# Patient Record
Sex: Female | Born: 1970 | Race: White | Hispanic: No | Marital: Married | Smoking: Never smoker
Health system: Southern US, Community
[De-identification: ages and names within clinical notes are randomized; demographics above are authoritative.]

---

## 2011-10-28 ENCOUNTER — Ambulatory Visit: Payer: Self-pay | Admitting: Internal Medicine

## 2011-10-28 LAB — URINALYSIS, COMPLETE
Bacteria: NEGATIVE
Bilirubin,UR: NEGATIVE
Glucose,UR: NEGATIVE mg/dL (ref 0–75)
Ketone: NEGATIVE
Leukocyte Esterase: NEGATIVE
Nitrite: NEGATIVE
Ph: 6.5 (ref 4.5–8.0)
Protein: NEGATIVE
Specific Gravity: 1.005 (ref 1.003–1.030)

## 2011-10-28 LAB — COMPREHENSIVE METABOLIC PANEL
Albumin: 3.7 g/dL (ref 3.4–5.0)
Alkaline Phosphatase: 78 U/L (ref 50–136)
Anion Gap: 5 — ABNORMAL LOW (ref 7–16)
Calcium, Total: 9 mg/dL (ref 8.5–10.1)
Chloride: 100 mmol/L (ref 98–107)
Co2: 31 mmol/L (ref 21–32)
Creatinine: 0.97 mg/dL (ref 0.60–1.30)
EGFR (Non-African Amer.): >60
Glucose: 98 mg/dL (ref 65–99)
Osmolality: 271 (ref 275–301)
Potassium: 3.9 mmol/L (ref 3.5–5.1)
SGOT(AST): 13 U/L — ABNORMAL LOW (ref 15–37)
Sodium: 136 mmol/L (ref 136–145)

## 2011-10-28 LAB — TSH: Thyroid Stimulating Horm: 1.01 u[IU]/mL

## 2011-10-28 LAB — CBC WITH DIFFERENTIAL/PLATELET
Basophil #: 0 10*3/uL (ref 0.0–0.1)
Basophil %: 0.6 %
Eosinophil #: 0.1 10*3/uL (ref 0.0–0.7)
Eosinophil %: 0.8 %
HCT: 42.1 % (ref 35.0–47.0)
HGB: 13.6 g/dL (ref 12.0–16.0)
Lymphocyte #: 1.9 10*3/uL (ref 1.0–3.6)
Lymphocyte %: 30.1 %
MCH: 28.4 pg (ref 26.0–34.0)
MCHC: 32.2 g/dL (ref 32.0–36.0)
MCV: 88 fL (ref 80–100)
Monocyte #: 0.5 10*3/uL (ref 0.0–0.7)
Monocyte %: 7.4 %
Neutrophil #: 3.8 10*3/uL (ref 1.4–6.5)
Neutrophil %: 61.1 %
Platelet: 384 10*3/uL (ref 150–440)
RBC: 4.77 10*6/uL (ref 3.80–5.20)
RDW: 12.4 % (ref 11.5–14.5)
WBC: 6.3 10*3/uL (ref 3.6–11.0)

## 2015-12-16 ENCOUNTER — Ambulatory Visit
Admission: EM | Admit: 2015-12-16 | Discharge: 2015-12-16 | Disposition: A | Discharge status: Home / Self Care | Payer: Managed Care, Other (non HMO) | Attending: Family Medicine | Admitting: Family Medicine

## 2015-12-16 DIAGNOSIS — T148 Other injury of unspecified body region: Secondary | ICD-10-CM

## 2015-12-16 DIAGNOSIS — W5581XA Bitten by other mammals, initial encounter: Secondary | ICD-10-CM

## 2015-12-16 DIAGNOSIS — T148XXA Other injury of unspecified body region, initial encounter: Secondary | ICD-10-CM

## 2015-12-16 MED ORDER — AMOXICILLIN-POT CLAVULANATE 875-125 MG PO TABS: 1.0000 | ORAL_TABLET | Freq: Two times a day (BID) | ORAL | Status: AC

## 2015-12-16 MED ORDER — MUPIROCIN 2 % EX OINT: 1.0000 "application " | TOPICAL_OINTMENT | Freq: Three times a day (TID) | CUTANEOUS | Status: AC

## 2015-12-16 NOTE — Discharge Instructions (Signed)
Contact Animal Control to report for animal pick up And establish with them  Antibiotic by mouth as directed Mupirocin antibiotics ointment to wounds Report for re-evaluation in 3 days [facility] or PCP- More quickly if inflammation increased heat- drainage     Animal Bite Animal bites can range from mild to serious. An animal bite can result in a scratch on the skin, a deep open cut, a puncture of the skin, a crush injury, or tearing away of the skin or a body part. A small bite from a house pet will usually not cause serious problems. However, some animal bites can become infected or injure a bone or other tissue.  Bites from certain animals can be more dangerous because of the risk of spreading rabies, which is a serious viral infection. This risk is higher with bites from stray animals or wild animals, such as raccoons, foxes, skunks, and bats. Dogs are responsible for most animal bites. Children are bitten more often than adults. SYMPTOMS  Common symptoms of an animal bite include:   Pain.   Bleeding.   Swelling.   Bruising.  DIAGNOSIS  This condition may be diagnosed based on a physical exam and medical history. Your health care provider will examine the wound and ask for details about the animal and how the bite happened. You may also have tests, such as:   Blood tests to check for infection or to determine if surgery is needed.  X-rays to check for damage to bones or joints.  Culture test. This uses a sample of fluid from the wound to check for infection. TREATMENT  Treatment varies depending on the location and type of animal bite and your medical history. Treatment may include:   Wound care. This often includes cleaning the wound, flushing the wound with saline solution, and applying a bandage (dressing). Sometimes, the wound is left open to heal because of the high risk of infection. However, in some cases, the wound may be closed with stitches (sutures), staples, skin  glue, or adhesive strips.   Antibiotic medicine.   Tetanus shot.   Rabies treatment if the animal could have rabies.  In some cases, bites that have become infected may require IV antibiotics and surgical treatment in the hospital.  HOME CARE INSTRUCTIONS Wound Care  Follow instructions from your health care provider about how to take care of your wound. Make sure you:  Wash your hands with soap and water before you change your dressing. If soap and water are not available, use hand sanitizer.  Change your dressing as told by your health care provider.  Leave sutures, skin glue, or adhesive strips in place. These skin closures may need to be in place for 2 weeks or longer. If adhesive strip edges start to loosen and curl up, you may trim the loose edges. Do not remove adhesive strips completely unless your health care provider tells you to do that.  Check your wound every day for signs of infection. Watch for:   Increasing redness, swelling, or pain.   Fluid, blood, or pus.  General Instructions  Take or apply over-the-counter and prescription medicines only as told by your health care provider.   If you were prescribed an antibiotic, take or apply it as told by your health care provider. Do not stop using the antibiotic even if your condition improves.   Keep the injured area raised (elevated) above the level of your heart while you are sitting or lying down, if this is possible.  If directed, apply ice to the injured area.   Put ice in a plastic bag.   Place a towel between your skin and the bag.   Leave the ice on for 20 minutes, 2-3 times per day.   Keep all follow-up visits as told by your health care provider. This is important.  SEEK MEDICAL CARE IF:  You have increasing redness, swelling, or pain at the site of your wound.   You have a general feeling of sickness (malaise).   You feel nauseous or you vomit.   You have pain that does  not get better.  SEEK IMMEDIATE MEDICAL CARE IF:  You have a red streak extending away from your wound.   You have fluid, blood, or pus coming from your wound.   You have a fever or chills.   You have trouble moving your injured area.   You have numbness or tingling extending beyond the wound.   This information is not intended to replace advice given to you by your health care provider. Make sure you discuss any questions you have with your health care provider.   Document Released: 07/01/2011 Document Revised: 07/04/2015 Document Reviewed: 02/28/2015 Elsevier Interactive Patient Education [employer]

## 2015-12-16 NOTE — ED Notes (Signed)
Cat scratched pt's left arm and bilateral lower legs. Cat's shots are UTD per pt. Pt reports cat is scheduled to be euthanized on Thursday due to previous history of aggressiveness.

## 2015-12-19 ENCOUNTER — Encounter: Payer: Self-pay | Admitting: Physician Assistant

## 2015-12-19 NOTE — ED Provider Notes (Signed)
CSN: [id number]     Arrival date & time 12/16/15  1522 History   First MD Initiated Contact with Patient 12/16/15 1646     Chief Complaint  Patient presents with  . Abrasion  . Animal Bite   (Consider location/radiation/quality/duration/timing/severity/associated sxs/prior Treatment) HPI 45 yo F presents with her husband shortly after being bitten and scratched quite severely by her mother's cat.  Cat has been irritable with humans before. Mother was treated 2 weeks ago for similar episode.  Decision has apparently  been made to put animal down but delayed a week. - apparently were trying to find a rescue. Animal has been in large dog crate in [place] garage so she could feed instead of mother.  She took it out today to photograph it- rested quietly in her lap and then suddenly attacked her arm biting and scratching the fell to ground and attacked her legs. Tetanus current  Cat rabies tag expired 12/06/15. Authorities were notified of aggressive animal  injuries and expired information. The family is to call Animal Control when they get home  Authorities report they  will meet the family at their home. Cat is loose in the garage   [facility] was contacted for info re protocol. They Rx atb and d/c home- they have experience with recent change  in Animal control Presenting to family home instead of care facility.  History reviewed. No pertinent past medical history. History reviewed. No pertinent past surgical history. History reviewed. No pertinent family history. Social History  Substance Use Topics  . Smoking status: Never Smoker   . Smokeless tobacco: None  . Alcohol Use: No   OB History    No data available     Review of Systems. Constitutional: No fever. No headache. Eyes: No visual changes. ENT:No sore throat. Cardiovascular:Negative for chest pain/palpitations Respiratory: Negative for shortness of breath Gastrointestinal: No abdominal pain. No nausea,vomiting,  diarrhea Genitourinary: Negative for dysuria. Normal urination. Menses current. Musculoskeletal: Negative for back pain. FROM extremities many linear scratches and bites, bleeding stopped Skin: Negative for rash Neurological: Negative for headache, focal weakness or numbness  Allergies  Penicillins and Sulfa antibiotics  Home Medications   Prior to Admission medications   Medication Sig Start Date End Date Taking? Authorizing Provider  amoxicillin-clavulanate (AUGMENTIN) 875-125 MG tablet Take 1 tablet by mouth every 12 (twelve) hours. 12/16/15   Rae Halsted, PA-C  mupirocin ointment (BACTROBAN) 2 % Apply 1 application topically 3 (three) times daily. 12/16/15   Rae Halsted, PA-C   Meds Ordered and Administered this Visit  Medications - No data to display  BP 171/97 mmHg  Pulse 92  Temp(Src) 99.3 F (37.4 C) (Oral)  Resp 16  Ht 5' 11.5" (1.816 m)  Wt 173 lb (78.472 kg)  BMI 23.79 kg/m2  SpO2 100%  LMP 12/14/2015 (Within Days) No data found.   Physical Exam   VS noted, WNL  GENERAL : NAD HEENT: mucoas moist, FROM head.neck, normal hearing , conjugate gaze RESP: CTA  B , no wheezing, no accessory muscle use CARD: RRR ABD: Not distended NEURO: Good attention, good recall, no gross neuro defecit PSYCH: speech and behavior appropriate Skin: extensive scratches of right forearm and bilateral legs below the knee.  Two moderately deep bite wound of lateral right upper arm and left posterior calf leg. Cleansed carefully.   Ambulates without difficulty. Tender.   ED Course  Procedures (including critical care time)  Labs Review Labs Reviewed - No data to display  Imaging Review No results found.   Wounds are cleansed with surgical scrub sponge and copious rinsing. Neosporin dressing and gauze wraps Keep clean. Punctures to be kept dressed at all times. Report if induration , swelling or drainage develops. PO And topical antibiotics as directed  Advised to avoid the  animal and let Trained officers capture   MDM   1. Animal bites    Wounds clean and dry- antibiotic ointment TID PO BID - may rest legs of heating pad if comfortable during healing process. encourage draining if develops- do not squeeze- use padded dressing. Return for re-eval in 3 days [facility] or PCP  Discharge Medication List as of 12/16/2015  5:40 PM    START taking these medications   Details  amoxicillin-clavulanate (AUGMENTIN) 875-125 MG tablet Take 1 tablet by mouth every 12 (twelve) hours., Starting 12/16/2015, Until Discontinued, Print    mupirocin ointment (BACTROBAN) 2 % Apply 1 application topically 3 (three) times daily., Starting 12/16/2015, Until Discontinued, Print          Rae Halsted, PA-C 12/19/15 [phone]

## 2016-09-05 ENCOUNTER — Other Ambulatory Visit: Payer: Self-pay | Admitting: Family Medicine

## 2016-09-05 DIAGNOSIS — R7989 Other specified abnormal findings of blood chemistry: Secondary | ICD-10-CM

## 2016-09-22 ENCOUNTER — Ambulatory Visit (HOSPITAL_COMMUNITY)
Admission: RE | Admit: 2016-09-22 | Discharge: 2016-09-22 | Disposition: A | Discharge status: Home / Self Care | Payer: Managed Care, Other (non HMO) | Source: Ambulatory Visit | Attending: Family Medicine | Admitting: Family Medicine

## 2016-09-22 DIAGNOSIS — R7989 Other specified abnormal findings of blood chemistry: Secondary | ICD-10-CM

## 2016-09-22 DIAGNOSIS — N281 Cyst of kidney, acquired: Secondary | ICD-10-CM | POA: Insufficient documentation

## 2016-10-06 ENCOUNTER — Other Ambulatory Visit: Payer: Self-pay | Admitting: Nephrology

## 2016-10-06 DIAGNOSIS — I10 Essential (primary) hypertension: Secondary | ICD-10-CM

## 2016-10-06 DIAGNOSIS — R7989 Other specified abnormal findings of blood chemistry: Secondary | ICD-10-CM

## 2016-10-06 DIAGNOSIS — IMO0002 Reserved for concepts with insufficient information to code with codable children: Secondary | ICD-10-CM

## 2016-10-07 ENCOUNTER — Other Ambulatory Visit: Payer: Self-pay | Admitting: Nephrology

## 2016-10-07 DIAGNOSIS — R7989 Other specified abnormal findings of blood chemistry: Secondary | ICD-10-CM

## 2016-10-07 DIAGNOSIS — I773 Arterial fibromuscular dysplasia: Secondary | ICD-10-CM

## 2016-10-07 DIAGNOSIS — I10 Essential (primary) hypertension: Secondary | ICD-10-CM

## 2016-10-16 ENCOUNTER — Ambulatory Visit
Admission: RE | Admit: 2016-10-16 | Discharge: 2016-10-16 | Disposition: A | Discharge status: Home / Self Care | Payer: Managed Care, Other (non HMO) | Source: Ambulatory Visit | Attending: Nephrology | Admitting: Nephrology

## 2016-10-16 DIAGNOSIS — R7989 Other specified abnormal findings of blood chemistry: Secondary | ICD-10-CM

## 2016-10-16 DIAGNOSIS — I10 Essential (primary) hypertension: Secondary | ICD-10-CM

## 2016-10-16 DIAGNOSIS — I773 Arterial fibromuscular dysplasia: Secondary | ICD-10-CM

## 2017-10-23 IMAGING — US US RENAL ARTERY STENOSIS
1 series · 14 of 25 positions shown · non-contrast
Comparison: 09/22/2016

CLINICAL DATA: Hypertension and elevated creatinine.

EXAM:
RENAL/URINARY TRACT ULTRASOUND
RENAL DUPLEX DOPPLER ULTRASOUND

[Series 1: us renal artery stenosis · 0.28mm/px · 14 of 34 slices shown]
[im 1/34]
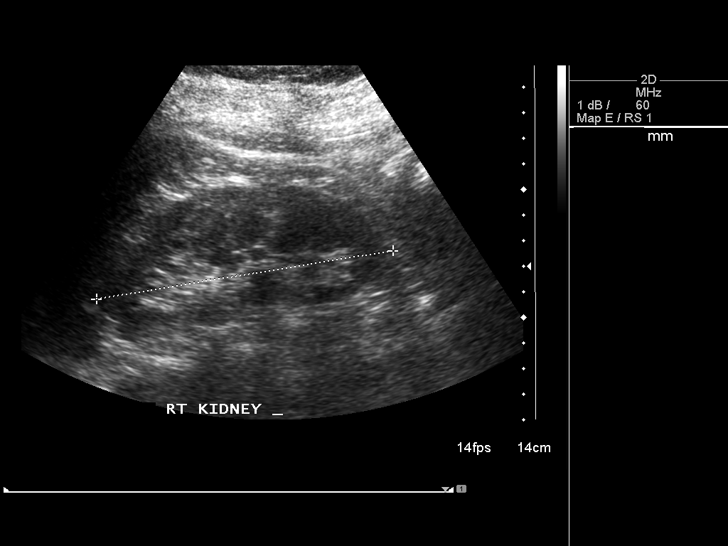
[im 3/34]
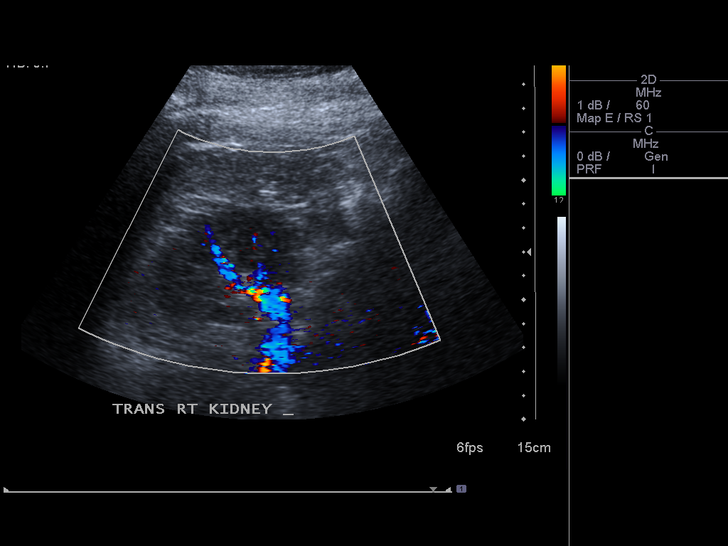
[im 6/34]
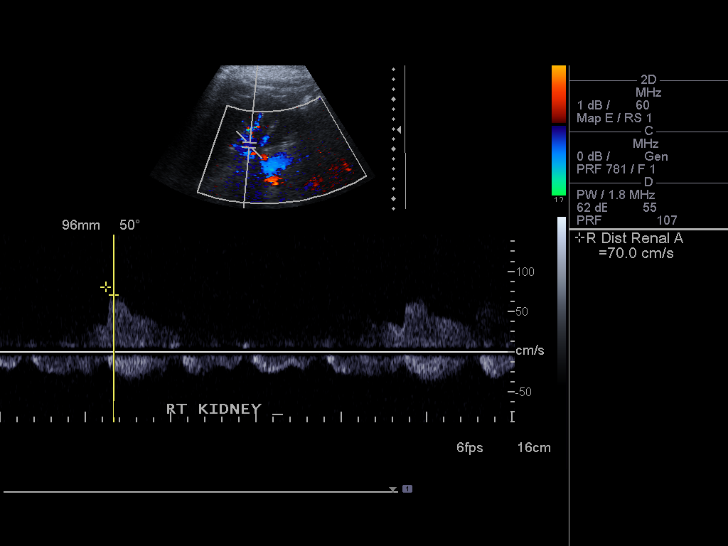
[im 9/34]
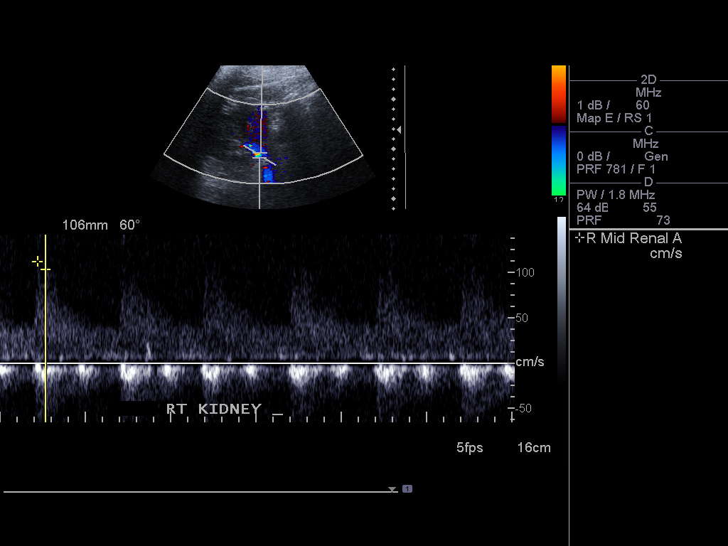
[im 12/34]
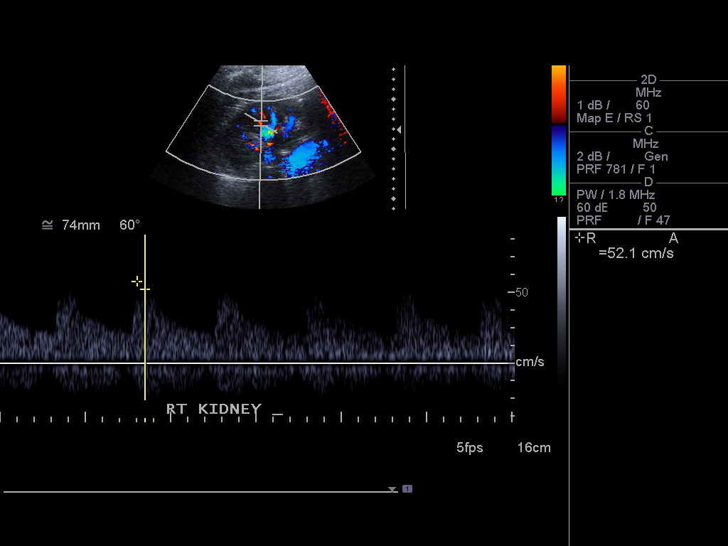
[im 13/34]
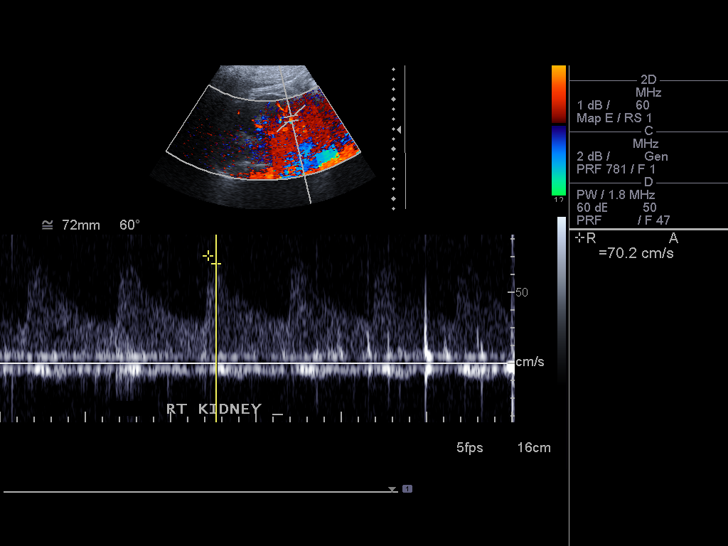
[im 16/34]
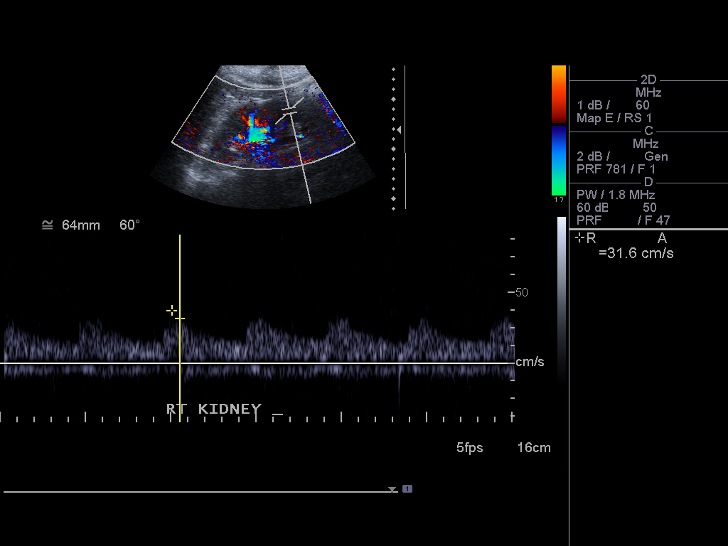
[im 18/34]
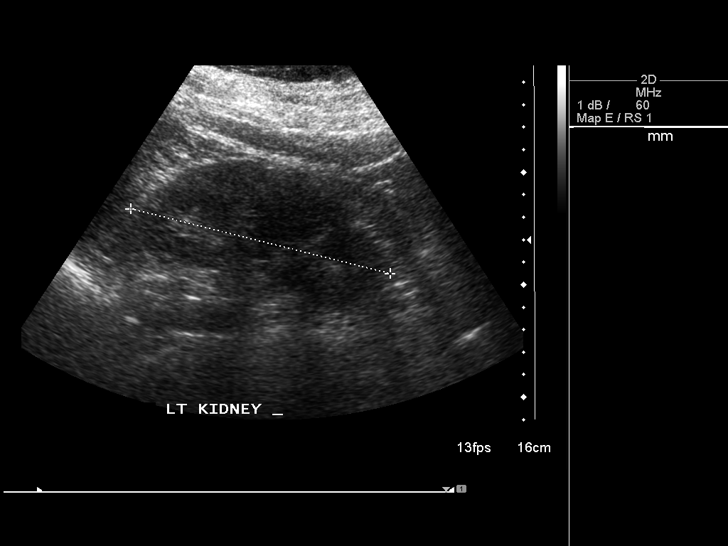
[im 21/34]
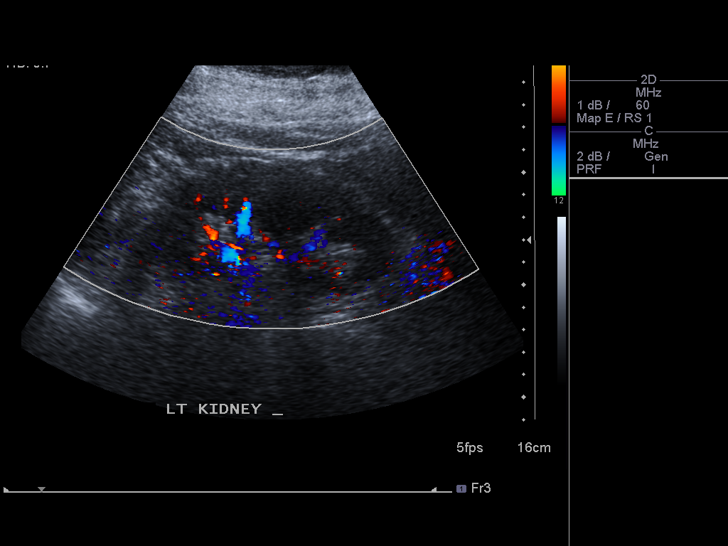
[im 23/34]
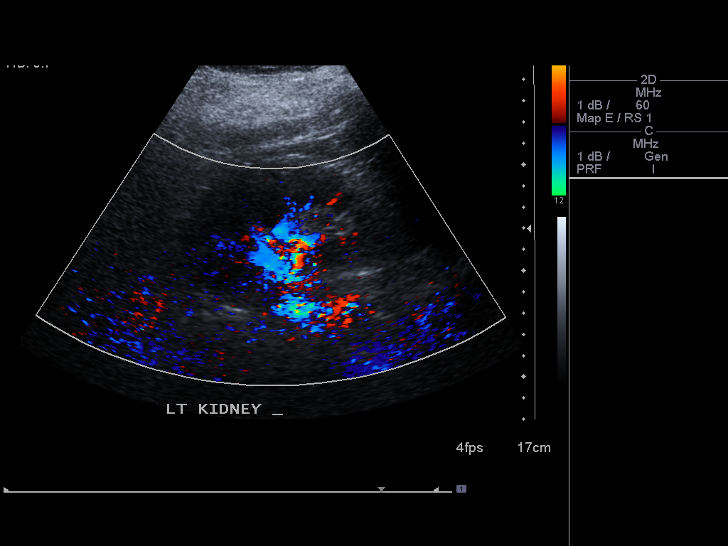
[im 25/34]
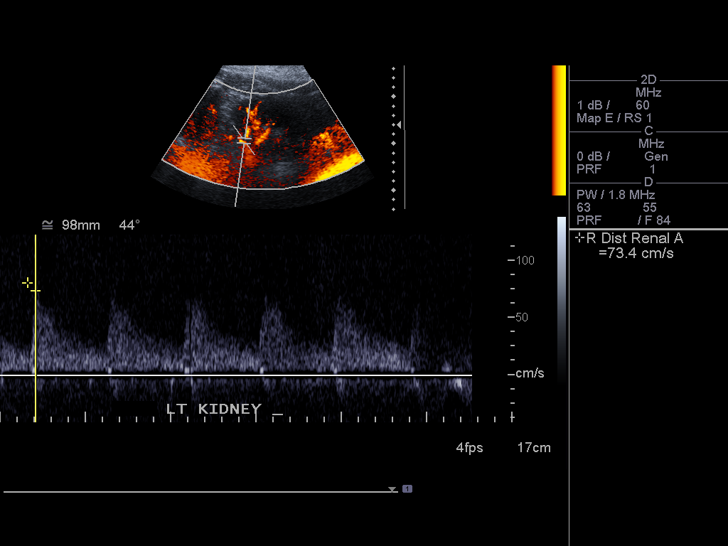
[im 28/34]
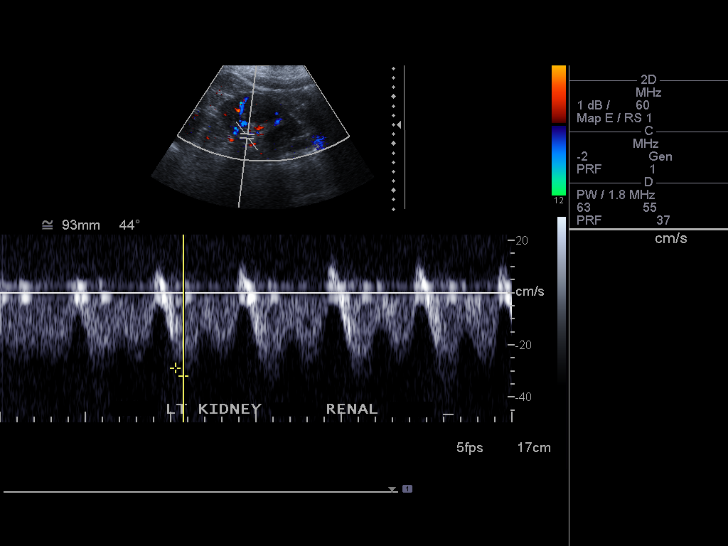
[im 31/34]
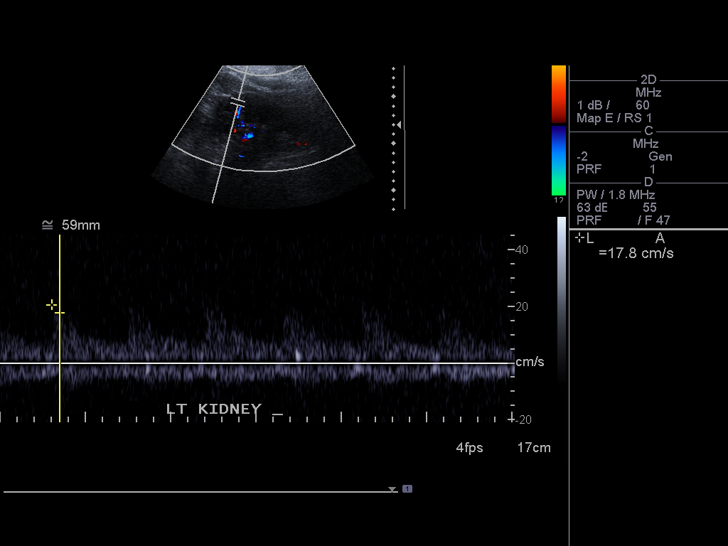
[im 34/34]
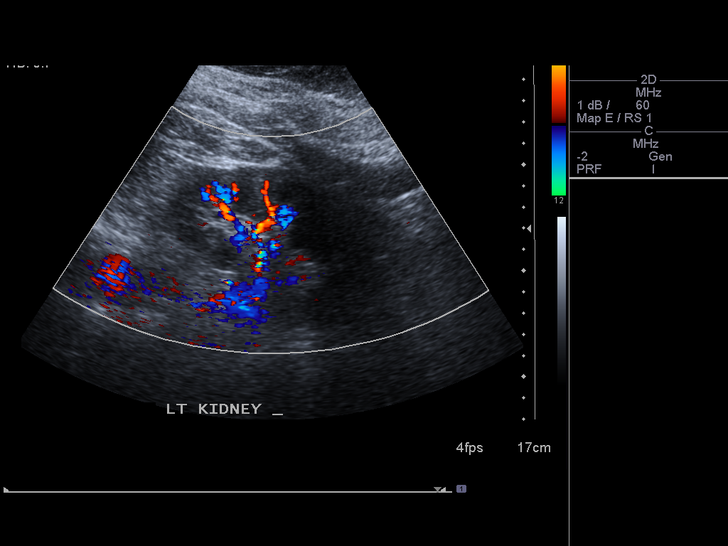

[14 of 25 positions shown; findings below may reference images not displayed]

FINDINGS: Right Kidney:

Length: 11.8 cm. Echogenicity within normal limits. No mass or
hydronephrosis visualized.

Left Kidney:

Length: 11.9 cm. Echogenicity within normal limits. No mass or
hydronephrosis visualized.

Bladder:  Not imaged.

RENAL DUPLEX ULTRASOUND

Right Renal Artery Velocities:

Origin:  100 cm/sec

Mid:  102 cm/sec

Hilum:  72 cm/sec

Interlobar:  60 cm/sec

Arcuate:  30 Cm/sec

Left Renal Artery Velocities:

Origin:  112 cm/sec

Mid:  65 cm/sec

Hilum:  70 cm/sec

Interlobar:  38 cm/sec

Arcuate:  16 cm/sec

Aortic Velocity:  106 Cm/sec

Right Renal-Aortic Ratios:

Origin:

Mid:

Hilum:

Interlobar:

Arcuate:

Left Renal-Aortic Ratios:

Origin:

Mid:

Hilum:

Interlobar:

Arcuate:

Additional findings: Renal veins are patent by color flow and
Doppler waveform imaging.
IMPRESSION: No evidence of renal artery stenosis.

## 2018-11-22 ENCOUNTER — Other Ambulatory Visit: Payer: Self-pay | Admitting: Family Medicine

## 2018-11-22 DIAGNOSIS — E041 Nontoxic single thyroid nodule: Secondary | ICD-10-CM

## 2018-11-29 ENCOUNTER — Ambulatory Visit
Admission: RE | Admit: 2018-11-29 | Discharge: 2018-11-29 | Disposition: A | Discharge status: Home / Self Care | Payer: Managed Care, Other (non HMO) | Source: Ambulatory Visit | Attending: Family Medicine | Admitting: Family Medicine

## 2018-11-29 ENCOUNTER — Encounter (INDEPENDENT_AMBULATORY_CARE_PROVIDER_SITE_OTHER): Payer: Self-pay

## 2018-11-29 DIAGNOSIS — E041 Nontoxic single thyroid nodule: Secondary | ICD-10-CM | POA: Diagnosis not present

## 2019-06-10 IMAGING — US US THYROID
1 series · 12 of 25 positions shown · non-contrast
Comparison: None available

CLINICAL DATA: Nodule noted on carotid ultrasound

EXAM:
THYROID ULTRASOUND
TECHNIQUE: Ultrasound examination of the thyroid gland and adjacent soft
tissues was performed.

[Series 1: us thyroid · 0.07mm/px · 106 acquisitions, 12 frames shown]
[im 5/106]
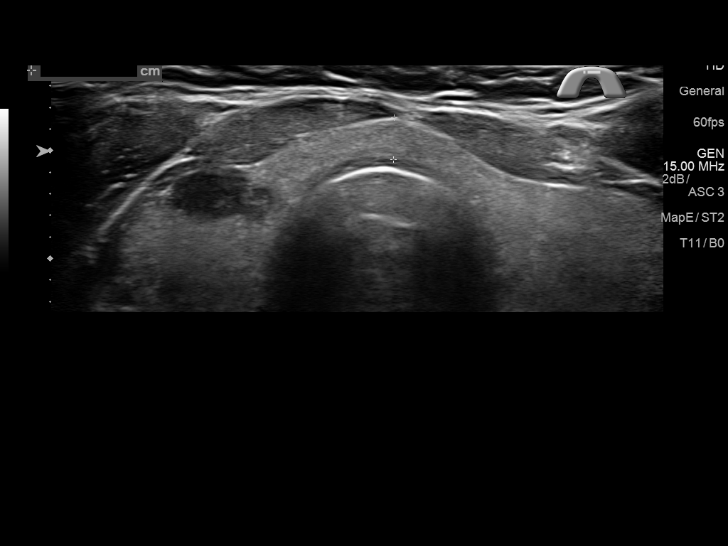
[im 14/106]
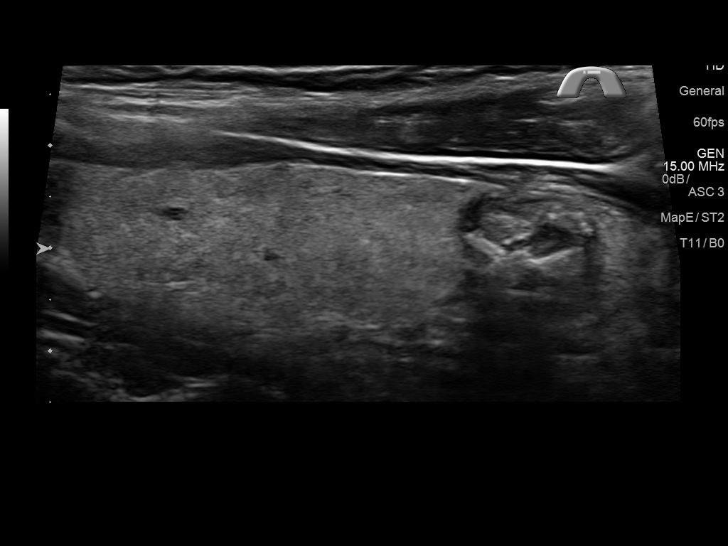
[im 22/106]
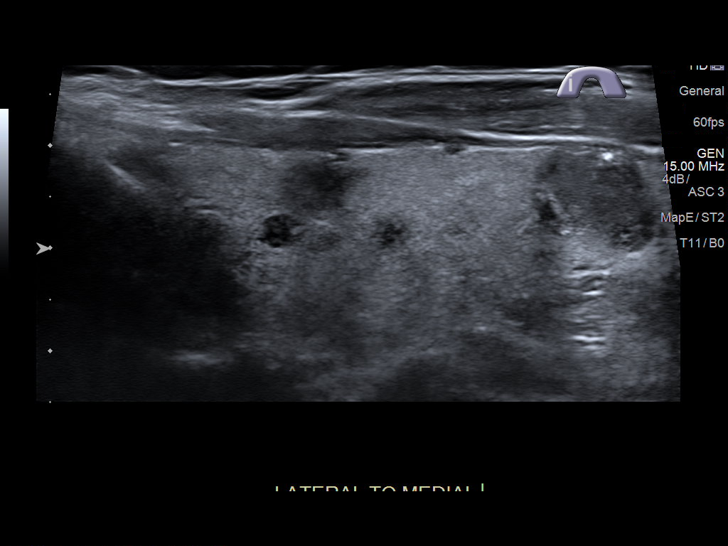
[im 31/106]
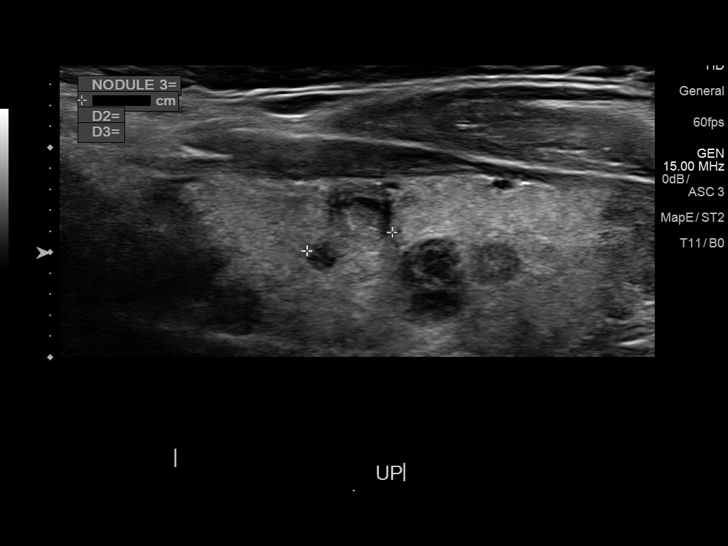
[im 40/106]
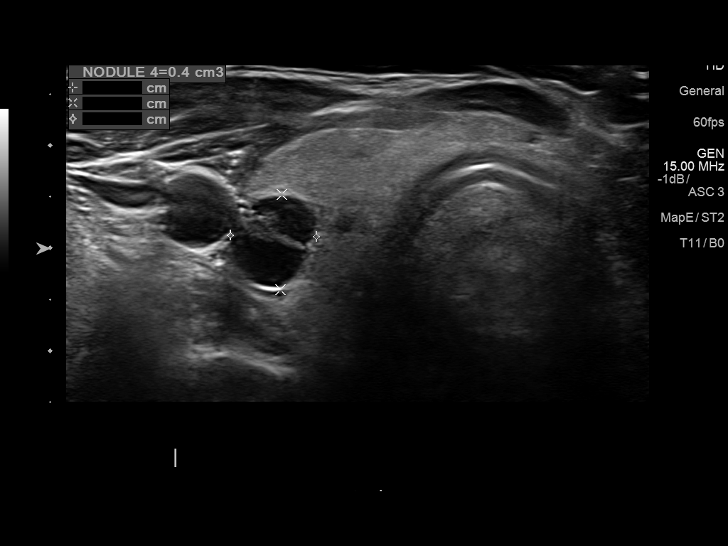
[im 49/106]
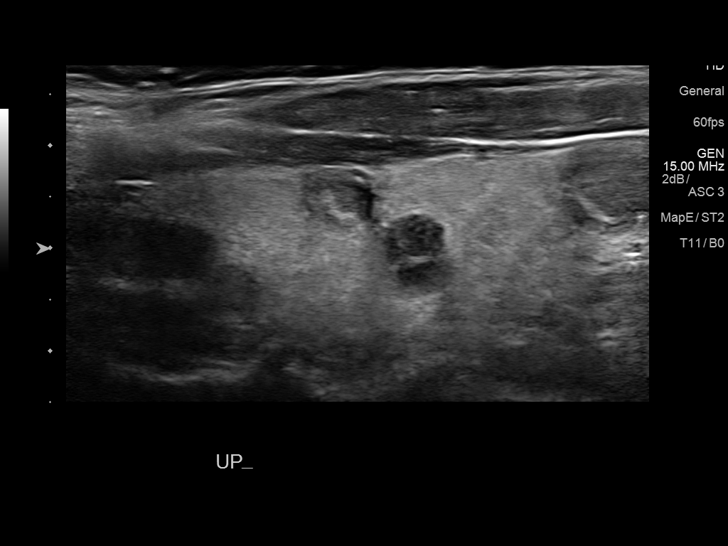
[im 57/106]
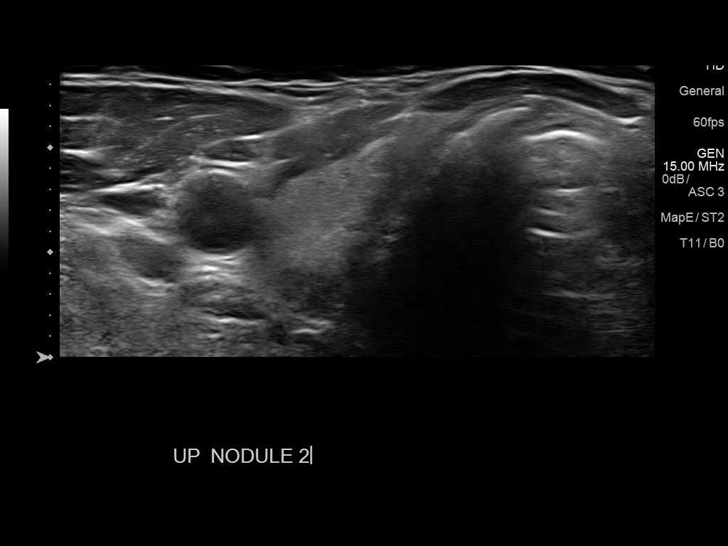
[im 66/106]
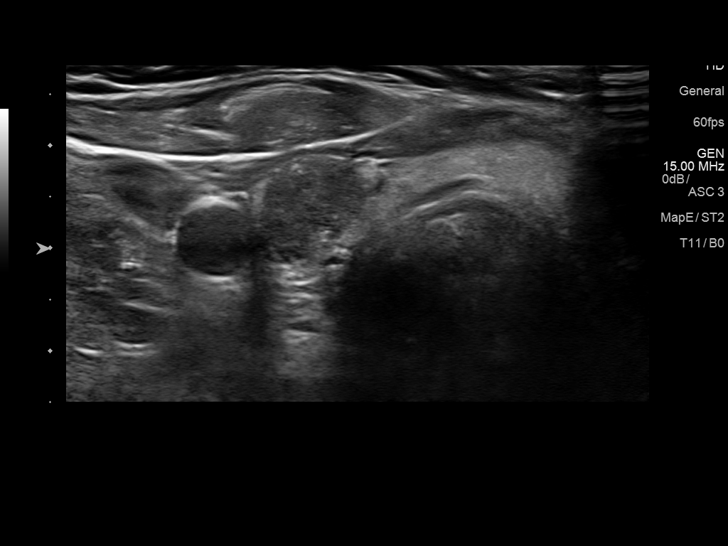
[im 75/106]
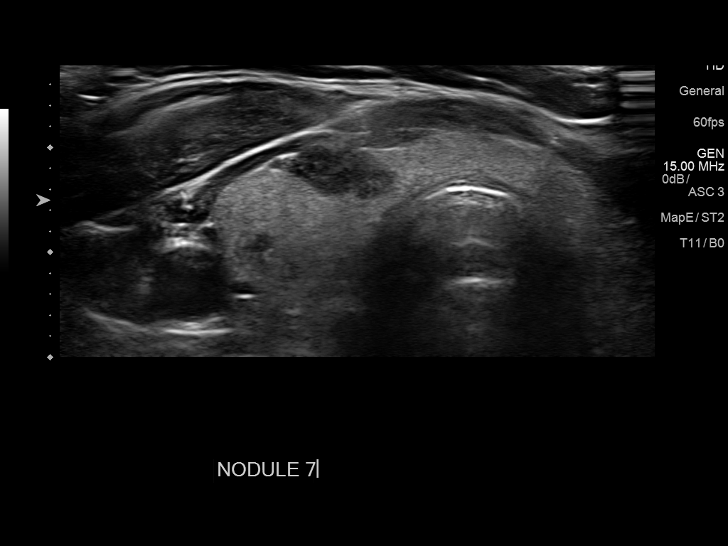
[im 84/106]
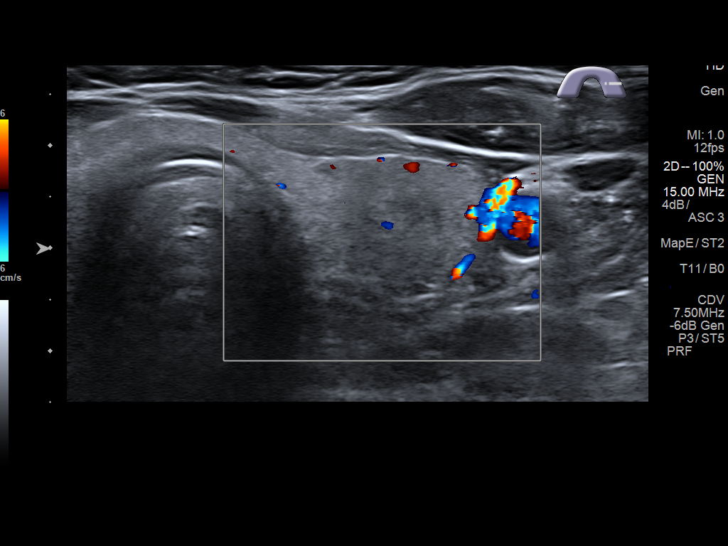
[im 92/106]
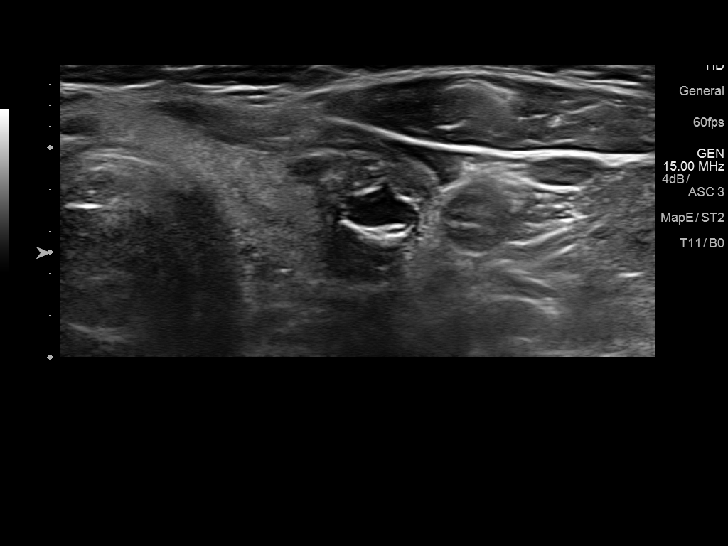
[im 101/106]
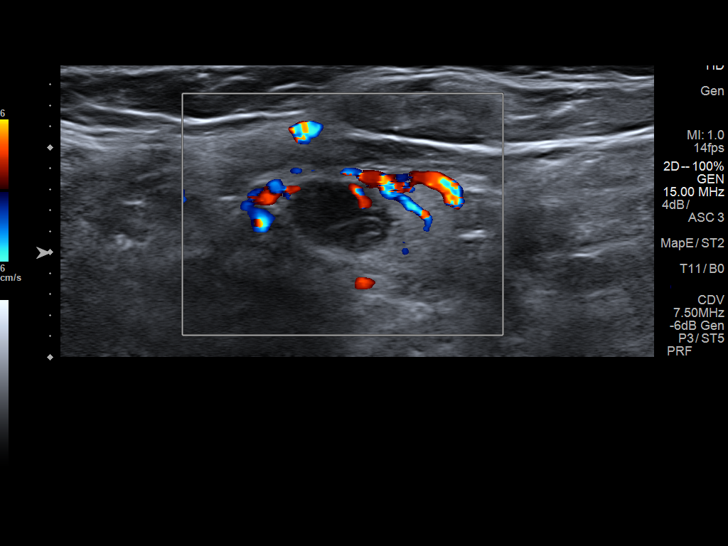

[12 of 25 positions shown; findings below may reference images not displayed]

FINDINGS: Parenchymal Echotexture: Mildly heterogenous

Isthmus: 0.4 cm thickness

Right lobe: 6.2 x 2 x 1.6 cm

Left lobe: 6.4 x 1.8 x 1.8 cm

_________________________________________________________

Estimated total number of nodules >/= 1 cm: 6-10

Number of spongiform nodules >/=  2 cm not described below (TR1): 0

Number of mixed cystic and solid nodules >/= 1.5 cm not described
below (TR2): 0

_________________________________________________________

Nodule # 1:

Location: Right; Superior

Maximum size: 1.3 cm; Other 2 dimensions: 0.9 x 0.6 cm

Composition: solid/almost completely solid (2)

Echogenicity: hypoechoic (2)

Shape: not taller-than-wide (0)

Margins: ill-defined (0)

Echogenic foci: none (0)

ACR TI-RADS total points: 4.

ACR TI-RADS risk category: TR4 (4-6 points).

ACR TI-RADS recommendations:

*Given size (>/= 1 - 1.4 cm) and appearance, a follow-up ultrasound
in 1 year should be considered based on TI-RADS criteria.

_________________________________________________________

Nodule # 3:

Location: Right; Superior

Maximum size: 1.7 cm; Other 2 dimensions: 0.8 x 0.7 cm

Composition: mixed cystic and solid (1)

Echogenicity: hypoechoic (2)

Shape: not taller-than-wide (0)

Margins: ill-defined (0)

Echogenic foci: none (0)

ACR TI-RADS total points: 3.

ACR TI-RADS risk category: TR3 (3 points).

ACR TI-RADS recommendations:

*Given size (>/= 1.5 - 2.4 cm) and appearance, a follow-up
ultrasound in 1 year should be considered based on TI-RADS criteria.

Nodule # 4: 1 cm mostly cystic mid right nodule; This nodule does
NOT meet TI-RADS criteria for biopsy or dedicated follow-up.

Nodule # 6:

Location: Right; Inferior

Maximum size: 1.3 cm; Other 2 dimensions: 1.2 x 1.1 cm

Composition: solid/almost completely solid (2)

Echogenicity: hypoechoic (2)

Shape: not taller-than-wide (0)

Margins: ill-defined (0)

Echogenic foci: peripheral calcifications (2)

ACR TI-RADS total points: 6.

ACR TI-RADS risk category: TR4 (4-6 points).

ACR TI-RADS recommendations:

*Given size (>/= 1 - 1.4 cm) and appearance, a follow-up ultrasound
in 1 year should be considered based on TI-RADS criteria.

_________________________________________________________

Nodule # 7:

Location: Right; Inferior medial

Maximum size: 1.2 cm; Other 2 dimensions: 1 x 0.5 cm

Composition: solid/almost completely solid (2)

Echogenicity: hypoechoic (2)

Shape: not taller-than-wide (0)

Margins: smooth (0)

Echogenic foci: none (0)

ACR TI-RADS total points: 4.

ACR TI-RADS risk category: TR4 (4-6 points).

ACR TI-RADS recommendations:

*Given size (>/= 1 - 1.4 cm) and appearance, a follow-up ultrasound
in 1 year should be considered based on TI-RADS criteria.

_________________________________________________________

Nodule # 9:

Location: Left; Mid

Maximum size: 1.5 cm; Other 2 dimensions: 1.3 x 1.2 cm

Composition: mixed cystic and solid (1)

Echogenicity: hypoechoic (2)

Shape: not taller-than-wide (0)

Margins: ill-defined (0)

Echogenic foci: peripheral calcifications (2)

ACR TI-RADS total points: 5.

ACR TI-RADS risk category: TR4 (4-6 points).

ACR TI-RADS recommendations:

**Given size (>/= 1.5 cm) and appearance, fine needle aspiration of
this moderately suspicious nodule should be considered based on
TI-RADS criteria.

_________________________________________________________

Nodule # 10:

Location: Left; Inferior

Maximum size: 1.2 cm; Other 2 dimensions: 1.1 x 0.7 cm

Composition: solid/almost completely solid (2)

Echogenicity: hypoechoic (2)

Shape: not taller-than-wide (0)

Margins: smooth (0)

Echogenic foci: none (0)

ACR TI-RADS total points: 4.

ACR TI-RADS risk category: TR4 (4-6 points).

ACR TI-RADS recommendations:

*Given size (>/= 1 - 1.4 cm) and appearance, a follow-up ultrasound
in 1 year should be considered based on TI-RADS criteria.

Additional scattered subcentimeter hypoechoic nodules without
calcifications, which do not meet criteria for biopsy or follow-up.
IMPRESSION: 1. Thyromegaly with bilateral nodules.
2. Recommend FNA biopsy of moderately suspicious 1.5 cm mid left
nodule.
3. Recommend annual/biennial ultrasound follow-up of additional
nodules as above, until stability x5 years confirmed.

The above is in keeping with the ACR TI-RADS recommendations - [HOSPITAL] 3014;[DATE].
